# Patient Record
Sex: Male | Born: 2005 | Race: White | Hispanic: Yes | Marital: Single
Health system: Southern US, Community
[De-identification: ages and names within clinical notes are randomized; demographics above are authoritative.]

---
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
## 2023-03-29 ENCOUNTER — Other Ambulatory Visit (LOCAL_COMMUNITY_HEALTH_CENTER): Payer: Self-pay

## 2023-03-29 DIAGNOSIS — Z111 Encounter for screening for respiratory tuberculosis: Secondary | ICD-10-CM

## 2023-03-29 NOTE — Progress Notes (Signed)
Pt seen in nurse clinic requested TB QFT prior taking medication for Psoriasis per his PCP. Signed ROI for QFT results and walked pt to lab. M.Trevon Strothers, LPN.

## 2023-04-03 ENCOUNTER — Telehealth: Payer: Self-pay | Admitting: Family Medicine

## 2023-04-03 LAB — QUANTIFERON-TB GOLD PLUS
QuantiFERON Mitogen Value: >10 IU/mL
QuantiFERON Nil Value: 0.41 IU/mL
QuantiFERON TB1 Ag Value: 0.44 IU/mL
QuantiFERON TB2 Ag Value: 0.35 IU/mL
QuantiFERON-TB Gold Plus: NEGATIVE

## 2023-04-03 NOTE — Telephone Encounter (Signed)
Pt is calling about a medication he's wanting to take from the dermatologist but doesn't know if he's okay to take it

## 2023-04-04 NOTE — Telephone Encounter (Signed)
Phone call to patient. RN informed patient of QFT negative results. Patient states he needs paper result copy and plans to pick up copy at [facility]. RN explained to patient that he can pick up result copy at info booth between Monday through Friday between 8 am to 5 pm. RN placed copy at info booth for patient. Jerel Shepherd, RN

## 2023-06-15 ENCOUNTER — Ambulatory Visit: Payer: Self-pay

## 2023-11-04 ENCOUNTER — Emergency Department
Admission: EM | Admit: 2023-11-04 | Discharge: 2023-11-05 | Disposition: A | Discharge status: Home / Self Care | Payer: MEDICAID | Attending: Emergency Medicine | Admitting: Emergency Medicine

## 2023-11-04 ENCOUNTER — Emergency Department: Payer: MEDICAID

## 2023-11-04 DIAGNOSIS — R0789 Other chest pain: Secondary | ICD-10-CM | POA: Diagnosis present

## 2023-11-04 DIAGNOSIS — R0602 Shortness of breath: Secondary | ICD-10-CM | POA: Insufficient documentation

## 2023-11-04 DIAGNOSIS — X500XXA Overexertion from strenuous movement or load, initial encounter: Secondary | ICD-10-CM | POA: Diagnosis not present

## 2023-11-04 DIAGNOSIS — Y93A9 Activity, other involving cardiorespiratory exercise: Secondary | ICD-10-CM | POA: Diagnosis not present

## 2023-11-04 MED ORDER — KETOROLAC TROMETHAMINE 30 MG/ML IJ SOLN
30.0000 mg | Freq: Once | INTRAMUSCULAR | Status: AC
  Administered 2023-11-05: 30 mg via INTRAMUSCULAR
  Filled 2023-11-04: qty 1

## 2023-11-04 MED ORDER — ACETAMINOPHEN 325 MG PO TABS
650.0000 mg | ORAL_TABLET | Freq: Once | ORAL | Status: AC
  Administered 2023-11-05: 650 mg via ORAL
  Filled 2023-11-04: qty 2

## 2023-11-04 MED ORDER — HYDROCODONE-ACETAMINOPHEN 5-325 MG PO TABS
1.0000 | ORAL_TABLET | Freq: Once | ORAL | Status: AC
  Administered 2023-11-05: 1 via ORAL
  Filled 2023-11-04: qty 1

## 2023-11-29 DIAGNOSIS — Z419 Encounter for procedure for purposes other than remedying health state, unspecified: Secondary | ICD-10-CM | POA: Diagnosis not present

## 2023-12-30 DIAGNOSIS — Z419 Encounter for procedure for purposes other than remedying health state, unspecified: Secondary | ICD-10-CM | POA: Diagnosis not present

## 2024-01-29 DIAGNOSIS — Z419 Encounter for procedure for purposes other than remedying health state, unspecified: Secondary | ICD-10-CM | POA: Diagnosis not present

## 2024-02-29 DIAGNOSIS — Z419 Encounter for procedure for purposes other than remedying health state, unspecified: Secondary | ICD-10-CM | POA: Diagnosis not present

## 2024-03-30 DIAGNOSIS — Z419 Encounter for procedure for purposes other than remedying health state, unspecified: Secondary | ICD-10-CM | POA: Diagnosis not present

## 2024-04-30 DIAGNOSIS — Z419 Encounter for procedure for purposes other than remedying health state, unspecified: Secondary | ICD-10-CM | POA: Diagnosis not present

## 2024-05-31 DIAGNOSIS — Z419 Encounter for procedure for purposes other than remedying health state, unspecified: Secondary | ICD-10-CM | POA: Diagnosis not present
